# Patient Record
Sex: Female | Born: 1935 | Race: White | Hispanic: No
Health system: Southern US, Community
[De-identification: ages and names within clinical notes are randomized; demographics above are authoritative.]

---

## 2006-11-23 ENCOUNTER — Ambulatory Visit: Payer: Self-pay | Admitting: Family Medicine

## 2008-02-14 ENCOUNTER — Ambulatory Visit: Payer: Self-pay | Admitting: Family Medicine

## 2009-04-16 ENCOUNTER — Ambulatory Visit: Payer: Self-pay

## 2011-08-12 ENCOUNTER — Ambulatory Visit: Payer: Self-pay | Admitting: Family Medicine

## 2011-09-21 ENCOUNTER — Ambulatory Visit: Payer: Self-pay | Admitting: Internal Medicine

## 2011-09-21 LAB — URINALYSIS, COMPLETE
Bilirubin,UR: NEGATIVE
Glucose,UR: NEGATIVE mg/dL (ref 0–75)
Ketone: NEGATIVE
Ph: 5 (ref 4.5–8.0)
Protein: 300
Specific Gravity: 1.025 (ref 1.003–1.030)
Squamous Epithelial: NONE SEEN
WBC UR: >30 /HPF (ref 0–5)

## 2011-09-22 LAB — URINE CULTURE

## 2012-10-27 LAB — COMPREHENSIVE METABOLIC PANEL
Albumin: 3.8 g/dL (ref 3.4–5.0)
Alkaline Phosphatase: 59 U/L (ref 50–136)
BUN: 30 mg/dL — ABNORMAL HIGH (ref 7–18)
Bilirubin,Total: 0.3 mg/dL (ref 0.2–1.0)
Calcium, Total: 9 mg/dL (ref 8.5–10.1)
Chloride: 110 mmol/L — ABNORMAL HIGH (ref 98–107)
Creatinine: 1.65 mg/dL — ABNORMAL HIGH (ref 0.60–1.30)
EGFR (African American): 33 — ABNORMAL LOW
Osmolality: 290 (ref 275–301)
Potassium: 4.1 mmol/L (ref 3.5–5.1)
SGOT(AST): 23 U/L (ref 15–37)
Sodium: 141 mmol/L (ref 136–145)
Total Protein: 7.7 g/dL (ref 6.4–8.2)

## 2012-10-27 LAB — CBC
HCT: 33.2 % — ABNORMAL LOW (ref 35.0–47.0)
MCH: 29.7 pg (ref 26.0–34.0)
MCV: 91 fL (ref 80–100)
RBC: 3.63 10*6/uL — ABNORMAL LOW (ref 3.80–5.20)

## 2012-10-27 LAB — CK TOTAL AND CKMB (NOT AT ARMC)
CK, Total: 52 U/L (ref 21–215)
CK-MB: 1.1 ng/mL (ref 0.5–3.6)

## 2012-10-27 LAB — TROPONIN I: Troponin-I: <0.02 ng/mL

## 2012-10-28 ENCOUNTER — Inpatient Hospital Stay: Payer: Self-pay | Admitting: Internal Medicine

## 2012-10-28 LAB — URINALYSIS, COMPLETE
Bilirubin,UR: NEGATIVE
Blood: NEGATIVE
Glucose,UR: NEGATIVE mg/dL (ref 0–75)
Leukocyte Esterase: NEGATIVE
Nitrite: NEGATIVE
Ph: 5 (ref 4.5–8.0)
Protein: 30
RBC,UR: <1 /HPF (ref 0–5)
Squamous Epithelial: <1

## 2012-10-28 LAB — CLOSTRIDIUM DIFFICILE BY PCR

## 2012-10-28 LAB — PROTIME-INR: INR: 2.3

## 2012-10-28 LAB — CREATININE, SERUM
Creatinine: 1.09 mg/dL (ref 0.60–1.30)
EGFR (Non-African Amer.): 47 — ABNORMAL LOW

## 2013-03-04 ENCOUNTER — Inpatient Hospital Stay: Payer: Self-pay | Admitting: Student

## 2013-03-04 ENCOUNTER — Ambulatory Visit: Payer: Self-pay | Admitting: Internal Medicine

## 2013-03-04 LAB — URINALYSIS, COMPLETE
Bilirubin,UR: NEGATIVE
Glucose,UR: NEGATIVE mg/dL (ref 0–75)
Hyaline Cast: 34
Nitrite: NEGATIVE
RBC,UR: 81 /HPF (ref 0–5)
Squamous Epithelial: 3
WBC UR: 798 /HPF (ref 0–5)

## 2013-03-04 LAB — CBC
MCH: 26.8 pg (ref 26.0–34.0)
MCHC: 31.4 g/dL — ABNORMAL LOW (ref 32.0–36.0)
MCV: 85 fL (ref 80–100)
RBC: 4.19 10*6/uL (ref 3.80–5.20)
RDW: 19.2 % — ABNORMAL HIGH (ref 11.5–14.5)
WBC: 5.7 10*3/uL (ref 3.6–11.0)

## 2013-03-04 LAB — CK TOTAL AND CKMB (NOT AT ARMC)
CK, Total: 37 U/L (ref 21–215)
CK, Total: 37 U/L (ref 21–215)
CK-MB: 1.9 ng/mL (ref 0.5–3.6)

## 2013-03-04 LAB — COMPREHENSIVE METABOLIC PANEL
Albumin: 3.4 g/dL (ref 3.4–5.0)
BUN: 51 mg/dL — ABNORMAL HIGH (ref 7–18)
Chloride: 111 mmol/L — ABNORMAL HIGH (ref 98–107)
Creatinine: 1.81 mg/dL — ABNORMAL HIGH (ref 0.60–1.30)
Glucose: 107 mg/dL — ABNORMAL HIGH (ref 65–99)
Potassium: 5.3 mmol/L — ABNORMAL HIGH (ref 3.5–5.1)
SGOT(AST): 22 U/L (ref 15–37)
SGPT (ALT): 10 U/L — ABNORMAL LOW (ref 12–78)
Total Protein: 7.1 g/dL (ref 6.4–8.2)

## 2013-03-04 LAB — BASIC METABOLIC PANEL
BUN: 50 mg/dL — ABNORMAL HIGH (ref 7–18)
Calcium, Total: 8.8 mg/dL (ref 8.5–10.1)
Co2: 18 mmol/L — ABNORMAL LOW (ref 21–32)
EGFR (African American): 29 — ABNORMAL LOW
EGFR (Non-African Amer.): 25 — ABNORMAL LOW
Glucose: 83 mg/dL (ref 65–99)
Osmolality: 292 (ref 275–301)

## 2013-03-04 LAB — TROPONIN I: Troponin-I: <0.02 ng/mL

## 2013-03-04 LAB — PROTIME-INR
INR: 3.1
Prothrombin Time: 31.1 secs — ABNORMAL HIGH (ref 11.5–14.7)

## 2013-03-05 LAB — COMPREHENSIVE METABOLIC PANEL
Alkaline Phosphatase: 72 U/L (ref 50–136)
Anion Gap: 10 (ref 7–16)
Bilirubin,Total: 0.5 mg/dL (ref 0.2–1.0)
Calcium, Total: 8.6 mg/dL (ref 8.5–10.1)
Chloride: 113 mmol/L — ABNORMAL HIGH (ref 98–107)
Co2: 19 mmol/L — ABNORMAL LOW (ref 21–32)
EGFR (Non-African Amer.): 32 — ABNORMAL LOW
Glucose: 63 mg/dL — ABNORMAL LOW (ref 65–99)
Osmolality: 293 (ref 275–301)
Potassium: 4.4 mmol/L (ref 3.5–5.1)
SGOT(AST): 14 U/L — ABNORMAL LOW (ref 15–37)
Sodium: 142 mmol/L (ref 136–145)
Total Protein: 6 g/dL — ABNORMAL LOW (ref 6.4–8.2)

## 2013-03-05 LAB — CBC WITH DIFFERENTIAL/PLATELET
HGB: 9.5 g/dL — ABNORMAL LOW (ref 12.0–16.0)
Lymphocyte %: 13.9 %
MCH: 26.9 pg (ref 26.0–34.0)
MCHC: 31.6 g/dL — ABNORMAL LOW (ref 32.0–36.0)
MCV: 85 fL (ref 80–100)
Neutrophil #: 3.1 10*3/uL (ref 1.4–6.5)
RBC: 3.54 10*6/uL — ABNORMAL LOW (ref 3.80–5.20)
WBC: 4.5 10*3/uL (ref 3.6–11.0)

## 2013-03-05 LAB — CK TOTAL AND CKMB (NOT AT ARMC)
CK, Total: 30 U/L (ref 21–215)
CK-MB: 1.4 ng/mL (ref 0.5–3.6)

## 2013-03-05 LAB — PROTIME-INR: INR: 2.9

## 2013-03-05 LAB — TROPONIN I: Troponin-I: <0.02 ng/mL

## 2013-03-06 LAB — CBC WITH DIFFERENTIAL/PLATELET
Basophil #: 0 10*3/uL (ref 0.0–0.1)
Basophil %: 0.6 %
Eosinophil #: 0.1 10*3/uL (ref 0.0–0.7)
Eosinophil %: 2.3 %
Lymphocyte #: 0.4 10*3/uL — ABNORMAL LOW (ref 1.0–3.6)
Lymphocyte %: 10.7 %
MCHC: 32 g/dL (ref 32.0–36.0)
Monocyte #: 0.6 x10 3/mm (ref 0.2–0.9)
Monocyte %: 16.6 %
Neutrophil #: 2.6 10*3/uL (ref 1.4–6.5)
Platelet: 93 10*3/uL — ABNORMAL LOW (ref 150–440)
RBC: 3.67 10*6/uL — ABNORMAL LOW (ref 3.80–5.20)
RDW: 20 % — ABNORMAL HIGH (ref 11.5–14.5)

## 2013-03-06 LAB — BASIC METABOLIC PANEL
Anion Gap: 8 (ref 7–16)
Chloride: 114 mmol/L — ABNORMAL HIGH (ref 98–107)
Co2: 21 mmol/L (ref 21–32)
Creatinine: 1.3 mg/dL (ref 0.60–1.30)
Glucose: 72 mg/dL (ref 65–99)
Osmolality: 290 (ref 275–301)
Potassium: 3.8 mmol/L (ref 3.5–5.1)
Sodium: 143 mmol/L (ref 136–145)

## 2013-03-07 LAB — URINE CULTURE

## 2013-03-07 LAB — PROTIME-INR: INR: 2.2

## 2013-03-08 LAB — BASIC METABOLIC PANEL
Anion Gap: 6 — ABNORMAL LOW (ref 7–16)
BUN: 15 mg/dL (ref 7–18)
Chloride: 112 mmol/L — ABNORMAL HIGH (ref 98–107)
EGFR (Non-African Amer.): 53 — ABNORMAL LOW
Potassium: 3.4 mmol/L — ABNORMAL LOW (ref 3.5–5.1)
Sodium: 144 mmol/L (ref 136–145)

## 2013-03-09 LAB — PROTIME-INR: INR: 2

## 2013-03-10 LAB — HEMOGLOBIN: HGB: 10.1 g/dL — ABNORMAL LOW (ref 12.0–16.0)

## 2013-03-10 LAB — PROTIME-INR: INR: 1.7

## 2013-03-11 LAB — CULTURE, BLOOD (SINGLE)

## 2013-03-12 ENCOUNTER — Ambulatory Visit: Payer: Self-pay | Admitting: Internal Medicine

## 2014-09-01 NOTE — H&P (Signed)
PATIENT NAME:  Shawna Browning, UPSHAW MR#:  [id number] DATE OF BIRTH:  Oct 08, 1928  PRIMARY CARE PHYSICIAN: Barry Brunner, MD.  HISTORY OF PRESENT ILLNESS: The patient is an 81 year old Caucasian female with past medical history significant for history of multiple medical problems including diabetes, hyperlipidemia, hypertension, stroke in May 2013 which affected her mentation with worsening dementia, who presents to the hospital with complaints of confusion.   According to the patient's daughter, who is present during my interview, the patient has been more confused over the past one day. She has been also wheezing and then has more increasingly lethargy. She is short of breath and was noted to be hypoxic.   On arrival to the Emergency Room, she was noted to be in atrial fibrillation with heart rate of 140. She was given 10 mg of Cardizem which improved her heart rate to around 100s to 120s. She, however, is on nonrebreather since she is very short of breath as well as hypoxic. Her chest x-ray reveals congestive heart failure. Hospitalist services were contacted for admission.   The patient's daughter tells me that she usually has some element of confusion but it seems to worsen intermittently. She also has intermittent agitation as well as shortness of breath in the  nursing facility. She has gone through multiple medical issues. She was admitted in August 2014 at [facility]. She had bleeding, received 3 units of packed red blood cells. Bleeding was from a sore in her mouth. Her Coumadin was stopped for a short period of time but then now it is restarted since her stroke was determined to be embolic.   Even before discharge from [facility], she was noted to be confused. The patient's daughter thinks that she is not on any medications to control her heart rhythm; however, upon looking into her medication list, it appears that the patient is in fact on metoprolol.    PAST MEDICAL HISTORY: Significant for history of stroke with some mental decline since May 2013, history of diabetes mellitus, paroxysmal atrial fibrillation. The patient is on Coumadin therapy. History of hypertension, hyperlipidemia, diabetes mellitus, depression.   ALLERGIES: No known drug allergies.   MEDICATIONS:  The patient is on: 1.  Claritin10 mg p.o. daily.  2.  Cozaar 100 mg daily.  3.  Iron sulfate 325 mg p.o. twice daily.  4.  Hydralazine 25 mg p.o. every 8 hours.  5.  Keppra 500 mg p.o. twice daily.  6.  Macrodantin 50 mg p.o. at bedtime.  7.  Metformin 1 g twice daily.  8.  Metoprolol tartrate 50 mg p.o. twice daily.  9.  Prilosec 10 mg daily.  10.  Tylenol 325 mg two capsules every 8 hours around the clock. 11.  Ventolin 2 puffs every 4 hours.  12.  Warfarin 2.5 mg p.o. daily.  13.  Zocor 40 mg p.o. daily.   ALLERGIES: No known drug allergies.   SOCIAL HISTORY: The patient lives in [facility]. No history of smoking or alcohol abuse.   FAMILY HISTORY: Diabetes. The patient's mother lived to be 44. The patient's father was 76 when he died.   REVIEW OF SYSTEMS: Not obtainable; however, the patient denies any chest pains. Denies any abdominal pains or headaches. Denies any shortness of breath at present.   PHYSICAL EXAMINATION:  VITAL SIGNS: On arrival to the Emergency Room, the patient's vital signs, temperature is not measured. Pulse 136, respirations 24, blood pressure 133/66, and during my evaluation, her  blood pressure is as high as 170 intermittently. Oxygen saturation were not checked but evidently were low to put her on a nonrebreather.  GENERAL: During my evaluation, the patient is well developed, thin, uncomfortable, in moderate distress, a Caucasian female lying on the stretcher.  HEENT: Her pupils were equal, reactive to light, extraocular muscles are intact. The patient does have conjunctivitis. There is purulent discharge from her  eyes.  No icterus. Has normal hearing. No pharyngeal erythema. Mucosa is moist. The patient does have a nonrebreather on her mouth. She is breathing through her mouth.  NECK: No nodes or masses. Supple, nontender, without mass or adenopathy. No JVD or carotid bruits. Full range of motion.  LUNGS: Diminished breath sounds bilaterally; however, no significant rales, rhonchi or wheezing. The patient does have labored inspirations as well as increased effort to breathe. She is tachypneic. She is in moderate respiratory distress. She wheezes through her mouth.  CARDIOVASCULAR: S1, S2 appreciated. Irregularly irregular, distant. Chest is nontender to palpation.  EXTREMITIES: Show 1+ pedal pulses. No lower extremity edema, calf tenderness or cyanosis were noted.  ABDOMEN: Soft, nontender. Bowel sounds are present. No hepatosplenomegaly or masses were noted.  RECTAL: Deferred.  MUSCLE STRENGTH: Able to move all extremities. No cyanosis. The patient does have kyphosis. Gait is not tested.  SKIN: No rashes, lesions, erythema, nodularity or induration. It was warm and dry to palpation, somewhat erythematous, lower extremities, more on the left, but peripheral pulses are 1+.  LYMPHATIC: No adenopathy in the cervical region.  NEUROLOGICAL: Cranial nerves grossly intact. Difficult to obtain sensory. The patient does not have any dysarthria; however, I am not able to discuss her much, practically nonverbal. She is alert but somnolent, poorly cooperative. Not able to assess her memory and not able to see if she is confused, but intermittently agitated as well as restless, moving on the stretcher constantly.   EKG showed AFib at the rate of 125 beats per minute, rapid ventricular response. T-wave abnormality. Consider lateral ischemia or digitalis effect according to EKG criteria. Nonspecific ST-T changes.   LABORATORIES: Glucose 107, beta-type natriuretic peptide 21,885. BUN and creatinine were 51 and 1.81. Potassium  was 5.3. Bicarbonate was 18. Estimated GFR for non-African American would be 26. Liver enzymes were unremarkable. Cardiac enzymes, first set negative. CBC: White blood cell count 5.7, hemoglobin 11.2, platelet count 127. Coagulation panel: Pro time 31.1 and INR was 3.1. Urinalysis is pending. ABGs were done on 100% FiO2 and pH was 7.32, pCO2 24, pO2 281 and saturation 98.1% on nonrebreather with bicarbonate level of 12.4 and lactic acid level elevated to 2.1   RADIOLOGIC STUDIES: Chest x-ray, portable, single view, on the 24th of October 2014, showed findings suggesting mild CHF. CT scan of head without contrast, 24th of October 2014, revealed no evidence of acute ischemic or hemorrhagic infarction, no intracranial mass effect. There is encephalomalacia in the right frontal lobe which is stable. There is mild diffuse cerebral and cerebellar atrophy with compensatory ventriculomegaly, which is stable according to radiologist.   ASSESSMENT AND PLAN:  1.  Atrial fibrillation, rapid ventricular response. Admit the patient to the medical floor. Start on Cardizem p.o., holding metoprolol at this time. We will also initiate her on Cardizem IV as needed since Cardizem seems to be working for her quite well. We will hold Coumadin since the patient is supra-therapeutic. Will get echocardiogram. We will follow INR in the morning and will restart Coumadin if it is therapeutic.  2.  Congestive heart  failure, left heart, acute, likely due to rapid ventricular response. We will continue oxygen therapy. We will give her Lasix 1 dose at this time. Will follow when the patient's heart rate is somewhat better. Hopefully congestive heart failure will improve by itself when the patient's heart rate improves.  3.  Renal failure, likely acute. We will hold ARB as well as metformin. We will follow in the morning.  4.  Hyperkalemia. We will give dose of Lasix.  5.  Acidosis, likely due to poor perfusion. We will be following the  patient's blood pressure readings.  6.  Hypertension. We will add nitroglycerin topically at this time due to congestive heart failure as well as questionable ability to swallow.  7.  Diabetes mellitus. We will continue diabetic diet as well as sliding-scale insulin.  8.  Hyperlipidemia. We will continue statin as long as she is able to swallow.   TIME SPENT: One hour.  CODE STATUS: I discussed the patient's CODE STATUS as well as multiple medical issues with patient's daughter who was agreeable to get palliative care consultation, which was also discussed with Sharia ReeveJosh, NP, for palliative care.    ____________________________ Katharina Caperima Angelyne Terwilliger, MD rv:np D: 03/04/2013 16:00:41 ET T: 03/04/2013 16:49:18 ET JOB#: [id number]  cc: Katharina Caperima Braxxton Stoudt, MD, <Dictator> Jorje GuildGlenn R. Beckey DowningWillett, MD Katharina CaperIMA Mahmood Boehringer MD ELECTRONICALLY SIGNED 04/10/2013 20:39

## 2014-09-01 NOTE — Discharge Summary (Signed)
PATIENT NAME:  Shawna Browning, Adasyn MR#:  [id number] DATE OF BIRTH:  Feb 05, 1929  DATE OF ADMISSION:  10/28/2012 DATE OF DISCHARGE:  10/28/2012  DISCHARGE DIAGNOSES:   1.  New onset seizure likely due to her recent cerebrovascular accident. She had a negative EEG, started on Keppra and stopped Wellbutrin per neurology.  2.  Acute renal failure likely acute renal in nature improved with hydration. Recommended better oral intake.   SECONDARY DIAGNOSES:   1.  Hypertension.  2.  Diabetes.  3.  Hyperlipidemia.  4.  Depression.  5.  History of cerebrovascular accident.  6.  Paroxysmal atrial fibrillation.   CONSULTATIONS:   1.  Neurology, Dr. Mellody DrownMatthew Smith.  2.  Physical therapy.   PROCEDURES/RADIOLOGY:   1.  CT scan of the head without contrast on June 18th showed no acute intracranial abnormality and old right frontal lobe infarct.  2.  The patient had an EEG on June 19th which was negative for any active seizure.   MAJOR LABORATORY PANEL:   1.  Urinalysis on admission was negative.  2.  Stool for C. difficile was negative.   HISTORY AND SHORT HOSPITAL COURSE:  The patient is an 81 year old female with the above-mentioned medical problems who was admitted for new onset seizure. The patient was loaded with Keppra and subsequently was started on oral Keppra. She was placed on p.r.n. Ativan for seizure precautions. Please see Dr. Teena IraniElgergawy's dictated history and physical for further details. The patient was also evaluated by neurology, Dr. Mellody DrownMatthew Smith, who recommended stopping Wellbutrin and continuing Keppra. The patient remained seizure-free. She was back to her normal mental status and was discharged back to her nursing facility in stable condition. On the date of discharge, her vital signs are as follows: Temperature 97.7, heart rate 60 per minute, respirations 18 per minute, blood pressure 156/68 mmHg and she is saturating 99% on room air.   PERTINENT PHYSICAL EXAMINATION ON THE DATE OF  DISCHARGE:  CARDIOVASCULAR: S1, S2 normal. No murmurs, rubs or gallop.  LUNGS: Clear to auscultation bilaterally. No wheezing, rales, rhonchi or crepitation.  ABDOMEN: Soft, benign.  NEUROLOGIC: Nonfocal examination. All other physical examination remained at baseline.   DISCHARGE MEDICATIONS:   1.  Prilosec 20 mg p.o. daily. 2.  Claritin 10 mg p.o. daily.  3.  Metoprolol 50 mg p.o. b.i.d.  4.  Iron sulfate 325 mg p.o. b.i.d.  5.  Cozaar 100 mg p.o. daily.  6.  Macrodantin 50 mg p.o. at bedtime.  7.  Zocor 40 mg p.o. at bedtime.  8.  Tylenol 650 mg p.o. every 8 hours.  9.  Warfarin 2.5 mg p.o. every evening.  10.  Metformin 1000 mg p.o. b.i.d. with meals.  11.  Ventolin 2 puffs inhaled every 4 hours as needed.  12.  Hydralazine 25 mg p.o. every 8 hours.  13.  Keppra 500 mg p.o. b.i.d.   DISCHARGE DIET:  Low sodium, low fat, low cholesterol, and 1800 ADA.   DISCHARGE ACTIVITY:  As tolerated.   DISCHARGE INSTRUCTIONS AND FOLLOWUP:  The patient was instructed to follow up with her new primary care physician, Dr. Duncan Dulleresa Tullo or [facility] Ronna [facility]ennifer Walker [facility]. Her daughter wanted to switch her primary care. She will need followup with her [facility] neurologist in 4 to 6 weeks.   TOTAL TIME DISCHARGING THIS PATIENT:  55 minutes.    ____________________________ Ellamae SiaVipul S. Sherryll BurgerShah, MD vss:si D: 10/28/2012 16:18:00 ET T: 10/28/2012 16:49:25 ET JOB#: [id number]  cc:  Hyde Sires S. Sherryll Burger, MD, <Dictator> Ginette Pitman. Dan Humphreys, MD Duncan Dull, MD Troy Sine Katrinka Blazing, MD [facility]   [name] [facility] MD ELECTRONICALLY SIGNED 10/31/2012 15:09

## 2014-09-01 NOTE — Consult Note (Signed)
Referring Physician:  Albertine Patricia   Primary Care Physician:  Albertine Patricia [place], [address], [place], [place] [address], [place] [phone]  Reason for Consult: Admit Date: 27-Oct-2012  Chief Complaint: seizure  Reason for Consult: new onset seizure   History of Present Illness: History of Present Illness:   81 yo RHD F presents to [facility] after new onset seizure.  This was witnessed by the staff at her long term facility.  No prior seizures before.  Pt states that she is a little depressed about the place that she is living but that she is not taking bupropion.  No other complaints now.  Pt does not remember event and was confused afterward.  No incontinence or tongue biting.  ROS:  General denies complaints    HEENT no complaints    Lungs no complaints    Cardiac no complaints    GI no complaints    GU no complaints    Musculoskeletal no complaints    Extremities no complaints    Skin no complaints    Neuro no complaints    Endocrine no complaints    Psych no complaints    Past Medical/Surgical Hx:  UTI'S:   Past Medical/ Surgical Hx:  Past Medical History 1. Diabetes mellitus.  2. Hypercholesterolemia.  3. Hypertension.  4. Depression.  5. History of CVA.  6. Paroxysmal atrial fibrillation.   Past Surgical History none   Home Medications: Medication Instructions Last Modified Date/Time  PriLOSEC 20 mg oral delayed release capsule 1 cap(s) orally once a day 19-Jun-14 04:36  Claritin 10 mg oral tablet 1 tab(s) orally once a day 19-Jun-14 04:36  Metoprolol Tartrate 50 mg oral tablet 1 tab(s) orally 2 times a day 19-Jun-14 04:36  ferrous sulfate 325 mg oral delayed release tablet 1 tab(s) orally 2 times a day 19-Jun-14 04:36  buPROPion 75 mg oral tablet 1 tab(s) orally 2 times a day 19-Jun-14 04:36  Cozaar 100 mg oral tablet 1 tab(s) orally once a day 19-Jun-14 04:36  Macrodantin macrocrystals 50 mg oral  capsule 50 milligram(s) orally once a day (at bedtime) 19-Jun-14 04:36  Zocor 40 mg oral tablet 1 tab(s) orally once a day (at bedtime) 19-Jun-14 04:36  Tylenol 325 mg oral tablet 2 tab(s) orally every 8 hours 19-Jun-14 04:36  warfarin 2.5 mg oral tablet 1 tab(s) orally once a day (in the evening) _0  19-Jun-14 04:36  metFORMIN 1000 mg oral tablet 1 tab(s) orally 2 times a day (with meals) 19-Jun-14 04:36  Ventolin HFA CFC free 90 mcg/inh inhalation aerosol 2 puff(s) inhaled every 4 hours as needed for wheezing 19-Jun-14 04:36  hydrALAZINE 25 mg oral tablet 1 tab(s) orally every 8 hours 19-Jun-14 04:36   Allergies:  No Known Allergies:   Allergies:  Allergies NKDA    Social/Family History: Employment Status: disabled  Lives With: alone  Living Arrangements: assisted living  Social History: no tob, no EtOH, no illicits, lives in long term facility  Family History: no seizure, no stroke   Vital Signs: **Vital Signs.:   19-Jun-14 04:10  Vital Signs Type Admission  Temperature Temperature (F) 97.4  Celsius 36.3  Temperature Source oral  Pulse Pulse 65  Respirations Respirations 20  Systolic BP Systolic BP 664  Diastolic BP (mmHg) Diastolic BP (mmHg) 60  Mean BP 93  Pulse Ox % Pulse Ox % 94  Oxygen Delivery Room Air/ 21 %   Physical Exam: General: NAD, resting comfortable, appropiate weight  HEENT: PERRLA, EOMI, nl VF, face symmetric, tongue midline, shoulder shrug equal  Neck: supple, no JVD, no bruits  Chest: CTA B, no wheezing, good movement  Cardiac: RRR, no murmurs, no edema, 2+ pulses  Extremities: no C/C/E, FROM   Neurologic Exam: Mental Status: alert and oriented x 3, normal speech and language, follows complex commands  Cranial Nerves: PERRLA, EOMI, nl VF, face symmetric, tongue midline, shoulder shrug equal  Motor Exam: 5/5 B normal, tone, no tremor  Deep Tendon Reflexes: 1+/4 B, downgoing plantars  Sensory Exam: pinprick, temperature, and vibration intact B   Coordination: FTN and HTS WNL, nl RAM   Lab Results:  Hepatic:  18-Jun-14 23:09   Bilirubin, Total 0.3  Alkaline Phosphatase 59  SGPT (ALT) 19  SGOT (AST) 23  Total Protein, Serum 7.7  Albumin, Serum 3.8  Routine Micro:  18-Jun-14 23:32   Micro Text Report CLOS.DIFF ASSAY, RT-PCR   COMMENT                   NEGATIVE-CLOS.DIFFICILE TOXIN NOT DETECTED BY PCR   ANTIBIOTIC                       Routine Chem:  18-Jun-14 23:09   Glucose, Serum  149  BUN  30  Creatinine (comp)  1.65  Sodium, Serum 141  Potassium, Serum 4.1  Chloride, Serum  110  CO2, Serum 21  Calcium (Total), Serum 9.0  Osmolality (calc) 290  eGFR (African American)  33  eGFR (Non-African American)  29 (eGFR values <38m/min/1.73 m2 may be an indication of chronic kidney disease (CKD). Calculated eGFR is useful in patients with stable renal function. The eGFR calculation will not be reliable in acutely ill patients when serum creatinine is changing rapidly. It is not useful in  patients on dialysis. The eGFR calculation may not be applicable to patients at the low and high extremes of body sizes, pregnant women, and vegetarians.)  Anion Gap 10  Cardiac:  18-Jun-14 23:09   CK, Total 52  CPK-MB, Serum 1.1 (Result(s) reported on 27 Oct 2012 at 11:52PM.)  Troponin I < 0.02 (0.00-0.05 0.05 ng/mL or less: NEGATIVE  Repeat testing in 3-6 hrs  if clinically indicated. >0.05 ng/mL: POTENTIAL  MYOCARDIAL INJURY. Repeat  testing in 3-6 hrs if  clinically indicated. NOTE: An increase or decrease  of 30% or more on serial  testing suggests a  clinically important change)  Routine UA:  19-Jun-14 00:48   Color (UA) Yellow  Clarity (UA) Clear  Glucose (UA) Negative  Bilirubin (UA) Negative  Ketones (UA) Negative  Specific Gravity (UA) 1.017  Blood (UA) Negative  pH (UA) 5.0  Protein (UA) 30 mg/dL  Nitrite (UA) Negative  Leukocyte Esterase (UA) Negative (Result(s) reported on 28 Oct 2012 at 02:07AM.)   RBC (UA) <1 /HPF  WBC (UA) 1 /HPF  Bacteria (UA) 2+  Epithelial Cells (UA) <1 /HPF  Mucous (UA) PRESENT  Hyaline Cast (UA) 1 /LPF (Result(s) reported on 28 Oct 2012 at 02:07AM.)  Routine Coag:  18-Jun-14 23:09   Prothrombin  24.5  INR 2.3 (INR reference interval applies to patients on anticoagulant therapy. A single INR therapeutic range for coumarins is not optimal for all indications; however, the suggested range for most indications is 2.0 - 3.0. Exceptions to the INR Reference Range may include: Prosthetic heart valves, acute myocardial infarction, prevention of myocardial infarction, and combinations of aspirin and anticoagulant. The need for a higher or lower  target INR must be assessed individually. Reference: The Pharmacology and Management of the Vitamin K  antagonists: the seventh ACCP Conference on Antithrombotic and Thrombolytic Therapy. CHENI.7782 Sept:126 (3suppl): N9146842. A HCT value >55% may artifactually increase the PT.  In one study,  the increase was an average of 25%. Reference:  "Effect on Routine and Special Coagulation Testing Values of Citrate Anticoagulant Adjustment in Patients with High HCT Values." American Journal of Clinical Pathology 2006;126:400-405.)  Routine Hem:  18-Jun-14 23:09   WBC (CBC) 8.1  RBC (CBC)  3.63  Hemoglobin (CBC)  10.8  Hematocrit (CBC)  33.2  Platelet Count (CBC) 155 (Result(s) reported on 27 Oct 2012 at 11:36PM.)  MCV 91  MCH 29.7  MCHC 32.5  RDW  15.3   Radiology Results: CT:    18-Jun-14 23:08, CT Head Without Contrast  CT Head Without Contrast   REASON FOR EXAM:    Seizure  COMMENTS:       PROCEDURE: CT  - CT HEAD WITHOUT CONTRAST  - Oct 27 2012 11:08PM     RESULT: Comparison:  None    Technique: Multiple axial images from the foramen magnum to the vertex   were obtained without IV contrast.    Findings:    There is no evidence for mass effect, midline shift, or extra-axial fluid   collections. There is  no evidence for space-occupying lesion,   intracranial hemorrhage, or acute cortical-based area of infarction.   There is a large area of encephalomalacia in the right frontal lobe from     old prior infarct. Mild prominence of the lateral ventricles is likely   related to central atrophy.    The osseous structures are unremarkable.    IMPRESSION:    No acute intracranial findings. Old right frontal lobe infarct.        Verified By: Gregor Hams, M.D., MD   Radiology Impression: Radiology Impression: CT of head personally reviewed by me and shows old R frontal infarct   Impression/Recommendations: Recommendations:   case d/w Dr. Manuella Ghazi reviewed by me notes reviewed by me   New onset seizure-  pt apparently had some previous fluctuations in mental status as well that could have been seizures.  Pt had an EEG at [facility] previous that was normal plus a normal EEG here but this does not rule out epilepsy. Old R frontal infarct-  most likely cardioembolic from Afib continue Keppra 53m BID stop Bupropion and can change to any other antidepressant no driving or operating heavy machinery x 6 months continue coumadin with goal INR 2-3 for stroke prevention ok [facility]rose Nakayam[facility]-3 months  Electronic Signatures: SJamison Neighbor(MD)  (Signed 19-Jun-14 14:32)  Authored: REFERRING PHYSICIAN, Primary Care Physician, Consult, History of Present Illness, Review of Systems, PAST MEDICAL/SURGICAL HISTORY, HOME MEDICATIONS, ALLERGIES, Social/Family History, NURSING VITAL SIGNS, Physical Exam-, LAB RESULTS, RADIOLOGY RESULTS, Recommendations   Last Updated: 19-Jun-14 14:32 by SJamison Neighbor(MD)

## 2014-09-01 NOTE — H&P (Signed)
PATIENT NAME:  Shawna Browning, Shawna Browning MR#:  [id number] DATE OF BIRTH:  [birth year]  DATE OF ADMISSION:  10/28/2012  REFERRING PHYSICIAN: Dr. Marjean Donna.   PRIMARY CARE PHYSICIAN: Currently following with Attending in the nursing home. Used to follow with Dr. Ilene Qua but not anymore.   CHIEF COMPLAINT: Seizure.   HISTORY OF PRESENT ILLNESS: This is an 81 year old female with significant past medical history of hypertension, hyperlipidemia, diabetes mellitus. Had a major CVA last year with diagnosis of paroxysmal Afib where she was started on warfarin. The patient, as per daughter, who gives most of the history, has been having some cognitive and memory problems since then. As well, she is having left lower extremity weakness. Ambulates occasionally with a walker but mainly dependent on a wheelchair. The patient had an episode of generalized seizure this evening. As per nursing home, the patient had generalized tonic-clonic seizure where she was unresponsive after that. Upon presentation to ED, the patient was confused, but her mentation continued to improve gradually. The patient was found to be stool and urine incontinent, but daughter reports she is usually incontinent over the last few months. As well, there was no tongue biting. The patient was loaded with IV Keppra in the ED. The patient had CT head without contrast which did show encephalomalacia from old right frontal lobe infarct. The patient is an extremely poor historian, cannot give any reliable history. Most of the history is obtained from the daughter at bedside and from ED staff and nursing home documentation. The patient had no recurrence of seizures in the ED. As per daughter, the patient does not have any new focal deficits and she appears to be back to her baseline at this point.   PAST MEDICAL HISTORY:  1. Diabetes mellitus.  2. Hypercholesterolemia.  3. Hypertension.  4. Depression.  5. History of CVA.  6. Paroxysmal atrial  fibrillation.   ALLERGIES: No known drug allergies.   SOCIAL HISTORY: The patient lives in a skilled nursing facility. No history of smoking or alcohol use.   FAMILY HISTORY: There is no family history of diabetes. Mother lived to be 30. Father lived to be 79 years old.   REVIEW OF SYSTEMS: Cannot be obtained at this point by the patient as she is still lethargic and confused.   HOME MEDICATIONS:  1. Tylenol 650 every 8 hours.  2. Cozaar 100 mg oral daily.  3. Warfarin 2.5 mg oral daily.  4. Metformin 1000 mg oral 2 times a day.  5. Claritin 10 mg oral daily.  6. Zocor 40 mg oral at bedtime.  7. Metoprolol tartrate 50 mg oral 2 times a day.  8. Ventolin 2 puffs 4 times a day.  9. Ferrous sulfate 325 mg oral 2 times a day.  10. Prilosec 20 mg oral daily.  11. Bupropion 75 mg oral 2 times a day.  12. Hydralazine 25 mg oral 2 times a day.  13. Nitrofurantoin 50 mg at bedtime.   PHYSICAL EXAMINATION:  VITAL SIGNS: Pulse 97, respiratory rate 18, blood pressure 174/96, saturating 98% on room air.  GENERAL: Elderly female, looks comfortable, in no apparent distress.  HEENT: Head is atraumatic, normocephalic. Pupils equal, reactive to light. Pink conjunctivae. Anicteric sclerae. Moist oral mucosa.  NECK: Supple. No thyromegaly. No JVD.  CHEST: Good air entry bilaterally. No wheezing, rales, rhonchi.  CARDIOVASCULAR: S1, S2 heard. No rubs, murmurs, gallops.  ABDOMEN: Soft, nontender, nondistended. Bowel sounds present.  EXTREMITIES: No edema. No clubbing. No cyanosis. Dorsalis pedis pulses  felt bilaterally.  PSYCHIATRIC: The patient wakes up, responds to verbal stimuli. Communicative but confused. Answers name. Knows she is in the hospital, but she cannot recall the name of the hospital. Does not know the name of the President, and does not know the date. Confused.  NEUROLOGIC: The patient is noncompliant with physical exam. Does not follow commands secondary to her lethargy but appears to  be moving all extremities without significant focal deficits. There appear to be some mild deficits in the left lower extremity.  SKIN: Normal skin turgor. Warm and dry.   PERTINENT LABS: Glucose 149, BUN 30, creatinine 1.65, sodium 141, potassium 4.1, chloride 110, CO2 21. Total protein 7.7, total bili 0.3, alk phos 59, AST 23, ALT 19. Troponin less than 0.02. White blood cell 8.1, hemoglobin 10.8, hematocrit 33.2, platelets 155. Urinalysis negative for leukocyte esterase and nitrites and white blood cells less than 1. CT brain showing encephalomalacia due to old infarction in the right frontal lobe.   ASSESSMENT AND PLAN:  1. New onset seizures: The patient appears to be having generalized tonic seizures. This is most likely related to her significant old right frontal cerebrovascular accident. Will check an electroencephalogram in the a.m. Will consult neurological service as well. Will have her on seizure precautions, on p.r.n. Ativan for seizure breakthrough and will start her on Keppra. Already loaded with intravenous Keppra in the Emergency Department. Will continue with 500 p.o. b.i.d. and will follow neurology's recommendations regarding medication.  2. Acute renal failure: The patient has elevated creatinine at 1.65. Her records last year showing creatinine of 0.9. Appears to be in acute renal failure. Will hold Cozaar. Will continue her with intravenous fluids. Will recheck creatinine level in a.m.  3. Paroxysmal atrial fibrillation: Currently in normal sinus rhythm. On anticoagulation. Will continue with warfarin. Will consult pharmacy to dose. Will check INR level. Will continue with metoprolol.  4. Chronic urinary tract infection: Continue with nitrofurantoin.  5. Diabetes mellitus: Hold oral agents. Continue with insulin sliding scale.  6. Hypertension, elevated: Will resume the patient on her home meds.  7. Depression: Continue with home medications.  8. Hyperlipidemia: Continue with  Zocor.  9. Deep vein thrombosis prophylaxis: The patient is on warfarin. Will have pharmacy consulted to dose the warfarin, is on warfarin due to secondary to her history of atrial fibrillation.   Daughter is her power of attorney. Reports the patient does not have a Living Will, and she reports she is FULL CODE.   TOTAL TIME SPENT ON ADMISSION AND PATIENT CARE: 55 minutes.   ____________________________ Albertine Patricia, MD dse:gb D: 10/28/2012 04:01:51 ET T: 10/28/2012 04:20:03 ET JOB#: [id number]  cc: Albertine Patricia, MD, <Dictator> Aleane Wesenberg Graciela Husbands MD ELECTRONICALLY SIGNED 10/29/2012 0:26

## 2014-09-01 NOTE — Discharge Summary (Signed)
PATIENT NAME:  Shawna Browning, Shawna Browning MR#:  [id number] DATE OF BIRTH:  06-21-1928  DATE OF ADMISSION:  03/04/2013 DATE OF DISCHARGE:  03/10/2013   PRIMARY CARE PHYSICIAN: Shawna MaplesGlenn R. Beckey DowningWillett, MD  CONSULTANTS: Shawna GraceNancy Phifer, MD, from palliative care.   CHIEF COMPLAINT: Brought in for confusion.   DISCHARGE DIAGNOSES:  1. Atrial fibrillation with rapid ventricular response.  2. Acute hypoxic respiratory failure likely secondary to acute diastolic congestive heart failure.  3. Acute on chronic renal failure.  4. Extended-spectrum beta-lactamase urinary tract infection.  5. Failure to thrive.  6. Advanced dementia.  7. Hyperkalemia.  8. Hypokalemia.  9. Acidosis.  10. History of stroke.  11. History of diabetes.  12. Hypertension.  13. Hyperlipidemia.  14. Depression.   DISCHARGE MEDICATIONS:  1. Prilosec 20 mg once a day.  2. Claritin 10 mg once a day.  3. Metoprolol tartrate 50 mg 2 times a day.  4. Tylenol 650 mg every 8 hours. 5. Keppra 500 mg every 12 hours.  6. Simvastatin 10 mg once a day.  7. Aspirin 81 mg daily.  8. Diltiazem 30 mg q.6 hours.  9. Meropenem 1 gram IV q.12 hours for 3 days, then stop, and discontinued the PICC line.   DIET: ADA.   ACTIVITY: As tolerated.   REFERRALS: To hospice.   FOLLOWUP: Please follow with PCP within 1 to 2 weeks.   DISCHARGE INSTRUCTIONS: Encourage p.o. intake. She will also be going with [facility] for palliative purposes.  HISTORY OF PRESENT ILLNESS AND HOSPITAL COURSE: For full details of H and P, please see the dictation on October 24 by Dr. Winona Browning, but briefly, this is a pleasant 81 year old female with a history of hypertension and stroke last year, with worsening dementia, who came in for confusion, shortness of breath, wheezing, lethargy and was noted to be in atrial fibrillation with RVR, rate of 140s. The patient was given IV Cardizem and also needed high FiO2 oxygen. The patient was admitted to the hospitalist service for atrial  fibrillation and acute respiratory failure, likely diastolic CHF in nature. The patient did have elevated BNP of 21,000 with acute renal failure as well, with initial creatinine of 1.81 with potassium of 5.3. The patient was ruled out for acute coronary syndrome with negative cardiac markers x3. The patient did not have any significant leukocytosis, with WBC of 5.7 on admission. Urine cultures were sent as the patient did have a positive UA with leukocyte esterase, elevated WBC of 798 and 3+ bacteria in the UA. She appeared labored with shortness of breath. The patient was maintained on high FiO2 and slowly weaned off to room air. The patient did have acute CHF, and echocardiogram was obtained showing EF of 60% to 65%, with moderate LVH. The patient did well in regards to the respiratory failure and weaned off of the oxygen. Currently, she is on room air. She is not volume overloaded. Her renal failure did improve as well. The atrial fibrillation was better controlled. We believed that respiratory failure was triggered by atrial fibrillation causing congestive heart failure. She did not have any significant fever or leukocytosis to suggest she was having a pneumonia. Currently, she is on day 7 of meropenem for her ESBL UTI, which is E. coli, and she was started on meropenem early in the admission. On day 5 of admission, 1 out of 4 bottles of the cultures of the blood came back gram-positive and appears to be coag-negative staph and likely contaminant. The patient again has improved, and  this is likely not a true infection. Currently, she is on room air, satting 95% with stable blood pressure of 120s over 70s and heart rate in the 70s and 80s generally. Overall, the patient has improved in regards to renal and respiratory function; however, has not improved significantly from a failure to thrive perspective. The patient has been having calorie count for the last 3 days, and on average the last 3 days, has been having  about 450 calories or so. This is not enough to meet survival, and the daughter is aware. She will be getting discharged with hospice at [facility]. Palliative care has been on board through the hospitalization. In regards to her diabetes, as her diet is not good, she will not be going on any diabetes medications. Her hyperkalemia is better with a dose of Lasix. The patient did have another dose of Lasix later in the hospitalization, and at this point, does not require daily dosage of Lasix as her p.o. intake is minimum and she does not appear to be labored. In regards to the atrial fibrillation, as stated above, it is rate controlled, and given the overall poor prognosis, the risks and benefits of continuation of anticoagulation were discussed with the daughter, who at this point does not want Coumadin, and she will be discharged on baby aspirin instead.   CODE STATUS: The patient is DNR.   TOTAL TIME SPENT: 35 minutes.   ____________________________ Krystal Eaton, MD sa:lb D: 03/10/2013 14:27:15 ET T: 03/10/2013 14:47:54 ET JOB#: [id number]  cc: Krystal Eaton, MD, <Dictator> Krystal Eaton MD ELECTRONICALLY SIGNED 03/31/2013 2:59

## 2014-11-28 IMAGING — CR DG CHEST 1V PORT
1 series · 1 of 1 positions shown · non-contrast
Comparison: none

REASON FOR EXAM: Shortness of Breath
COMMENTS:

PROCEDURE:     DXR - DXR PORTABLE CHEST SINGLE VIEW  - March 04, 2013  [DATE]
RESULT:     No prior studies. Cardiomegaly and pulmonary vascular prominence
with interstitial prominence and small possible right pleural effusion. Mild
CHF should be considered.

[ap]
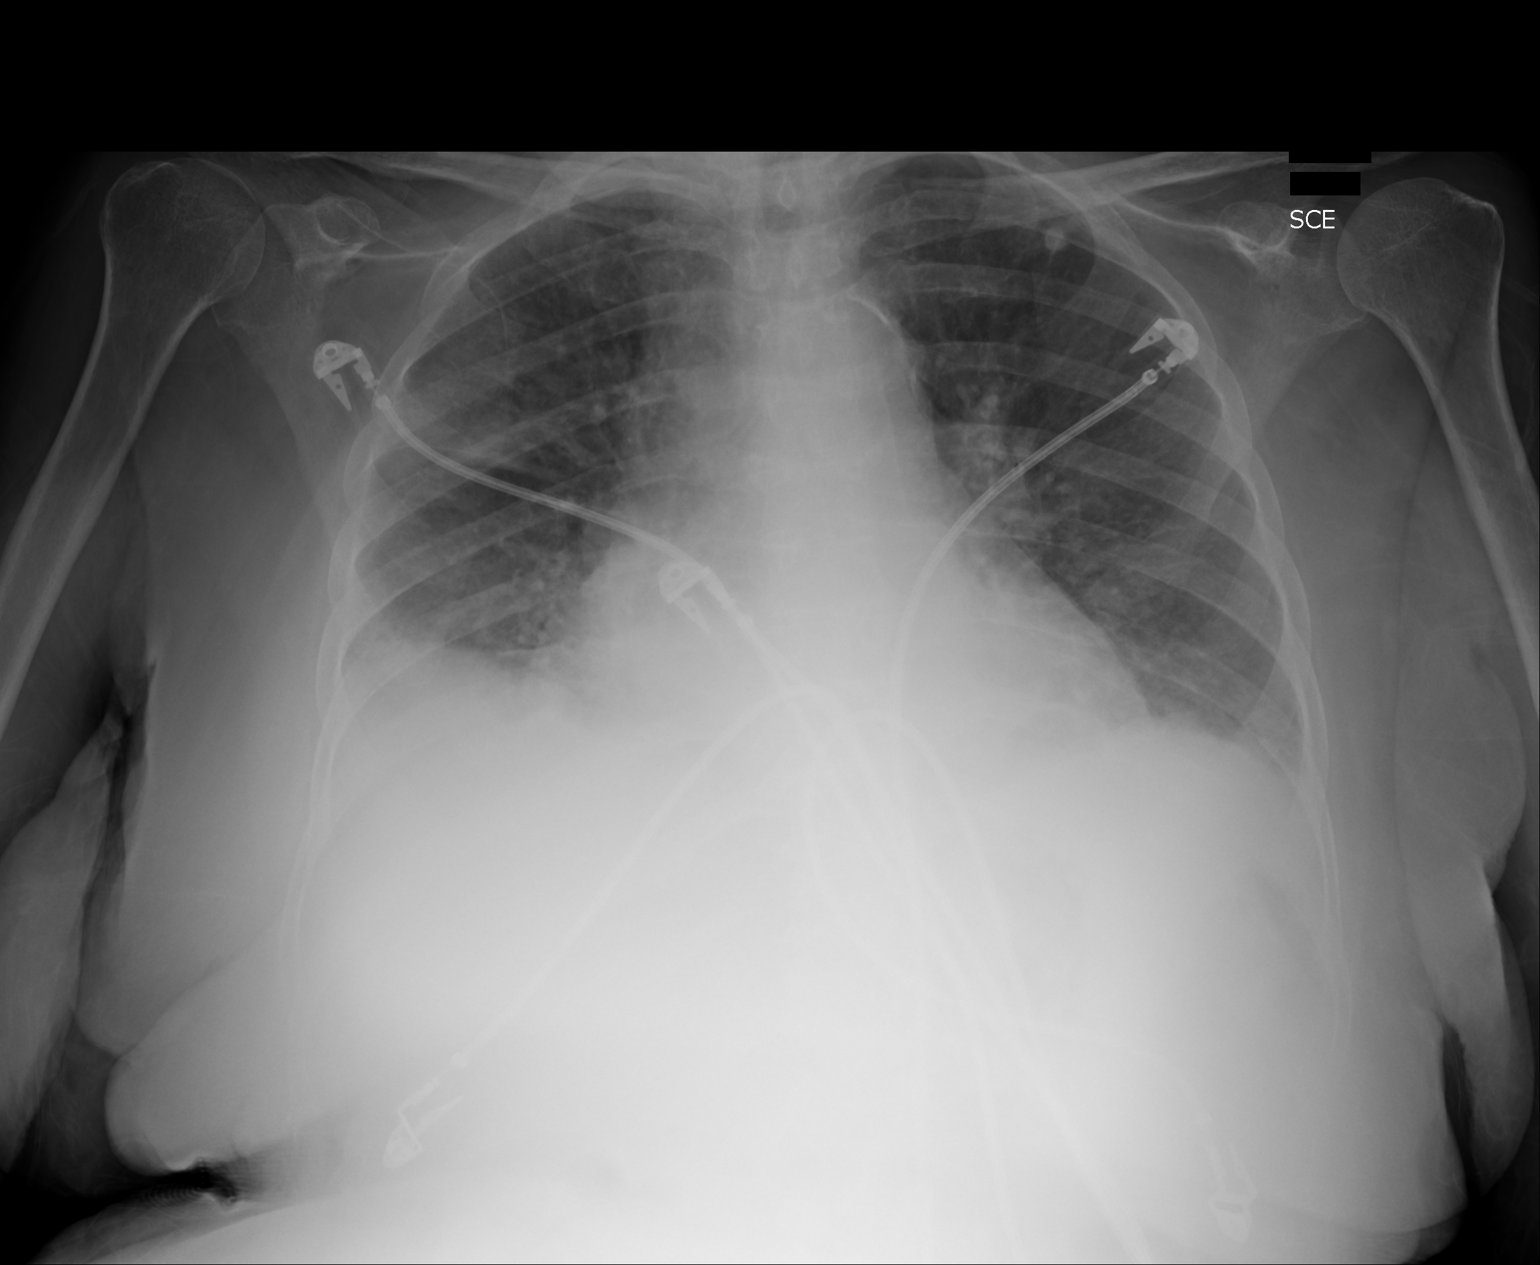

[1 of 1 positions shown; findings below may reference images not displayed]

IMPRESSION: Findings suggesting mild CHF.

## 2014-11-28 IMAGING — CT CT HEAD WITHOUT CONTRAST
2 series · 15 of 30 positions shown, 19 images · non-contrast
Comparison: none

REASON FOR EXAM: ams
COMMENTS:

[Series 3: bone · axial · 0.43mm/px · z∈[-96,-76]mm · 2 of 29 slices shown]
[im 3/29  bone]
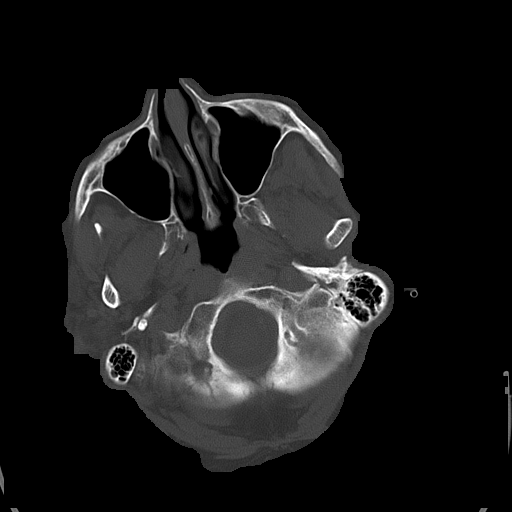
[im 7/29  bone]
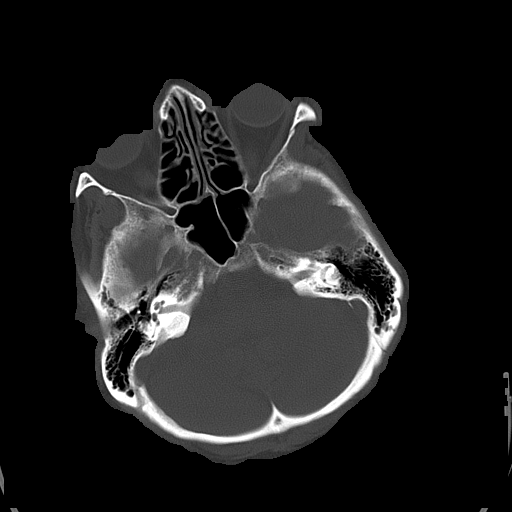

[Series 4: soft (id) · axial · 0.43mm/px · z∈[-94,+26]mm · 13 of 30 slices shown, 17 images]
[im 3/30  brain]
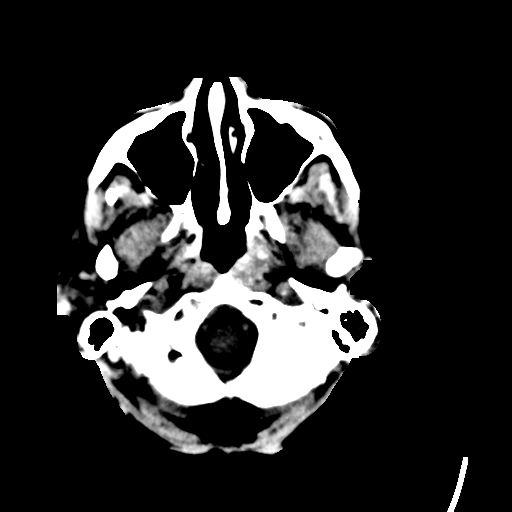
[im 3/30  bone]
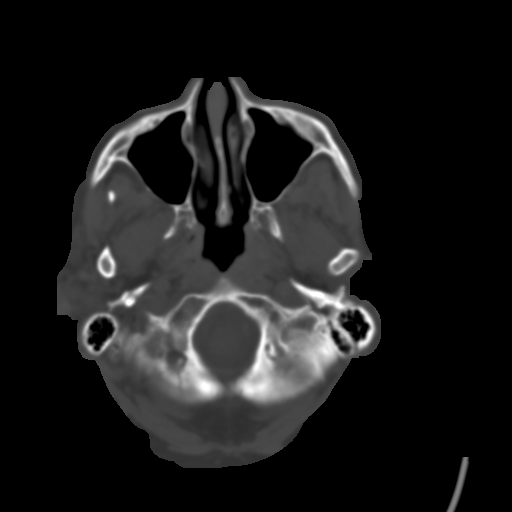
[im 5/30  brain]
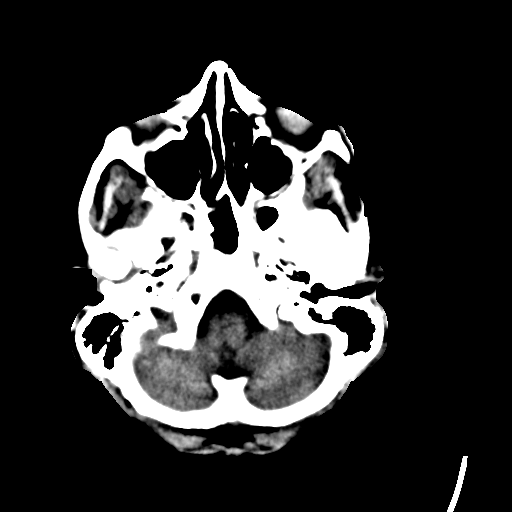
[im 7/30  brain]
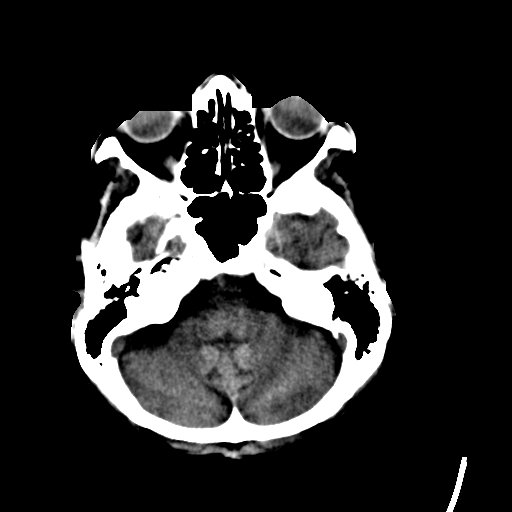
[im 9/30  brain]
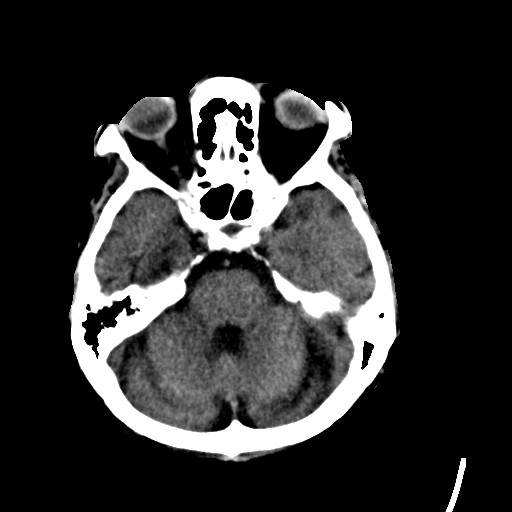
[im 11/30  brain]
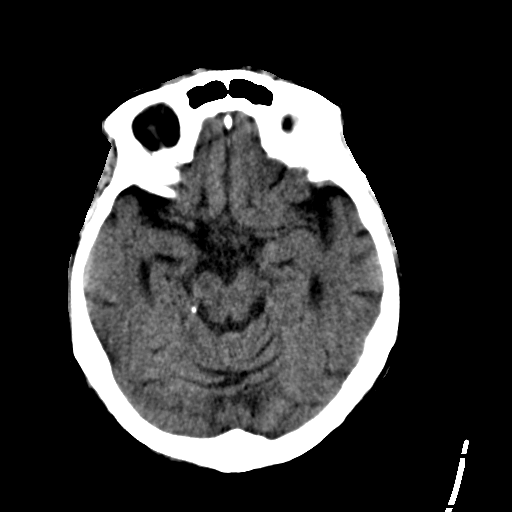
[im 11/30  bone]
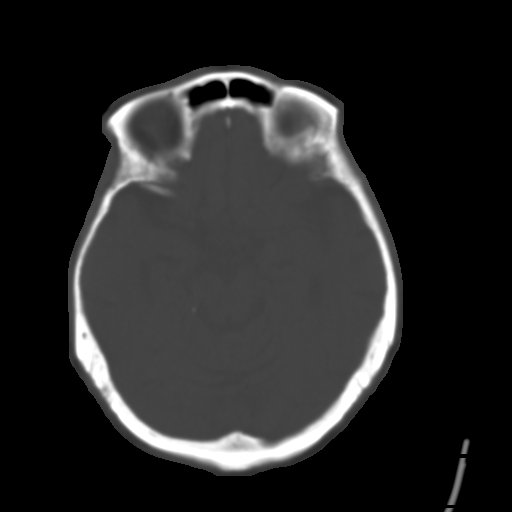
[im 13/30  brain]
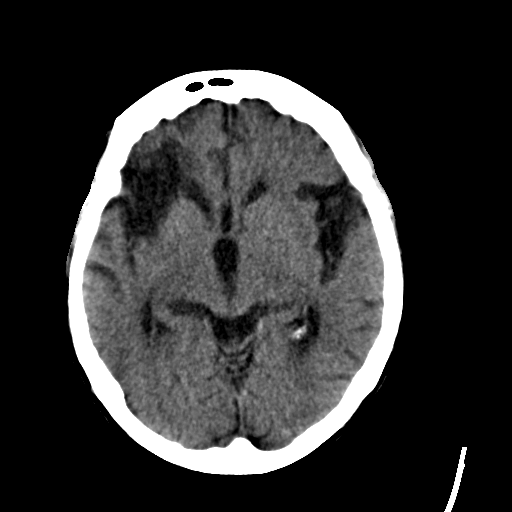
[im 15/30  brain]
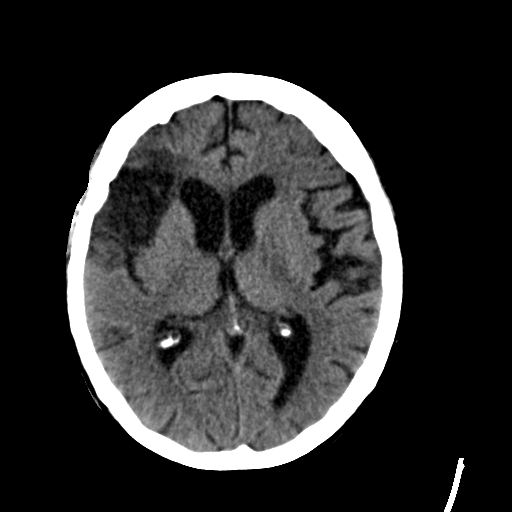
[im 17/30  brain]
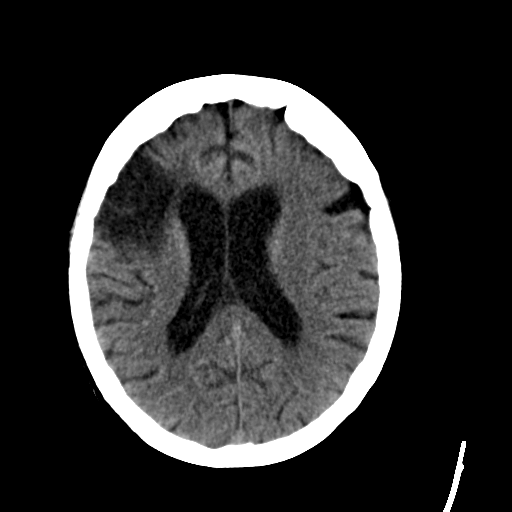
[im 19/30  brain]
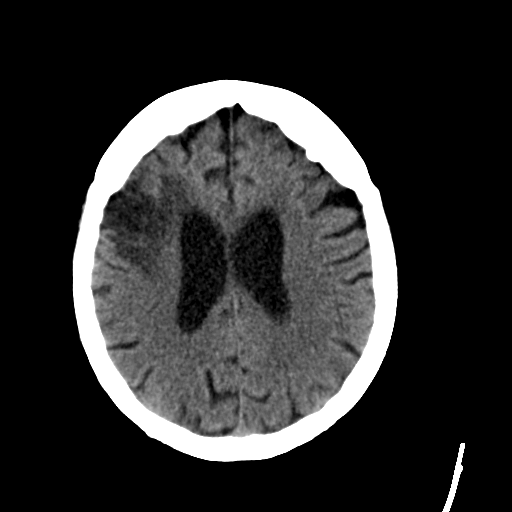
[im 19/30  bone]
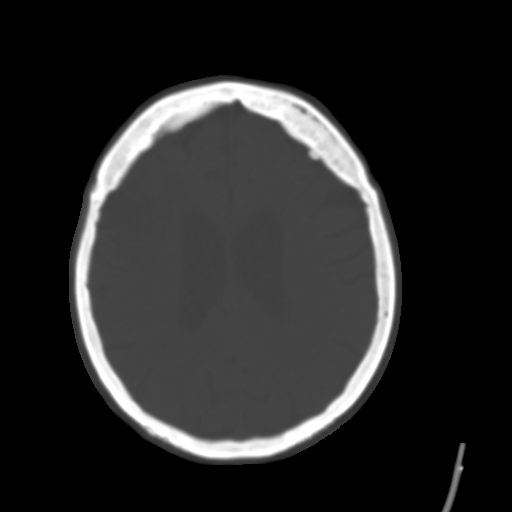
[im 21/30  brain]
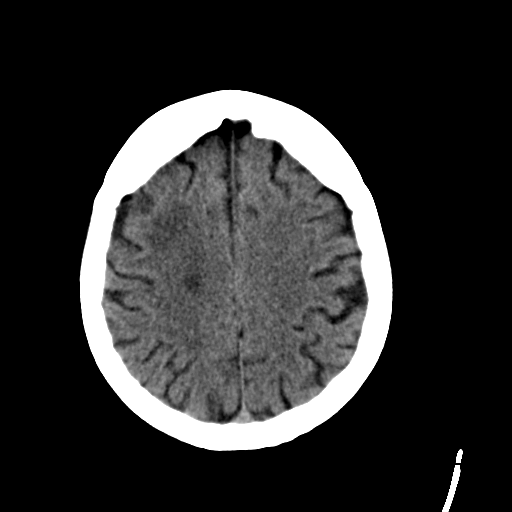
[im 23/30  brain]
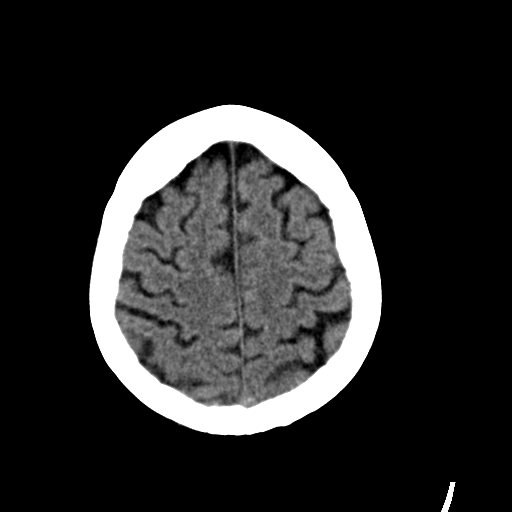
[im 25/30  brain]
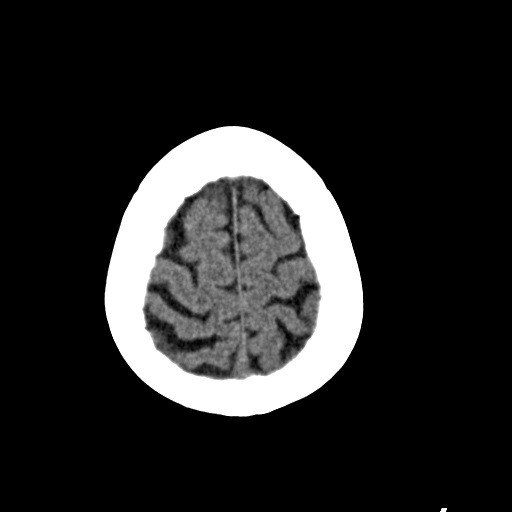
[im 27/30  brain]
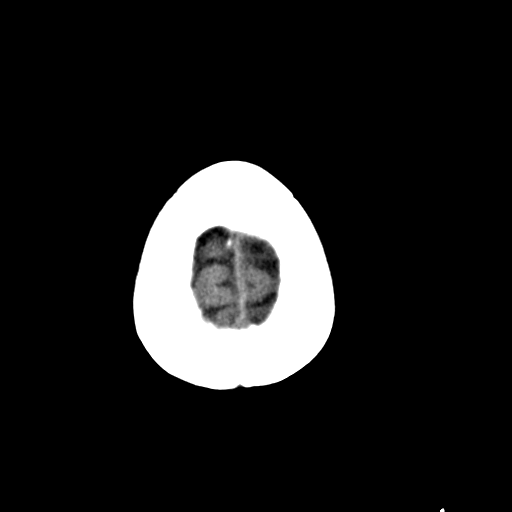
[im 27/30  bone]
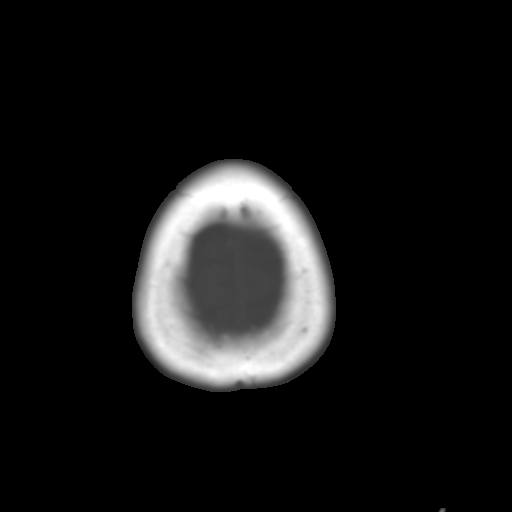

[15 of 30 positions shown; findings below may reference images not displayed]

PROCEDURE:     CT  - CT HEAD WITHOUT CONTRAST  - March 04, 2013  [DATE]

RESULT:     Axial noncontrast CT scanning was performed through the brain
with reconstructions at 5 mm intervals and slice thicknesses. Comparison is
made to the study 27 October, 2012.

There is a large area of stable encephalomalacia in the right frontal lobe.
There is mild diffuse cerebral and cerebellar atrophy with compensatory
ventriculomegaly which is also stable. There is no evidence of an acute
intracranial hemorrhage nor of an evolving ischemic infarction. The
cerebellum and brainstem are normal in density.

At bone window settings there is no evidence of an acute skull fracture. The
observed portions of the paranasal sinuses and mastoid air cells are clear.
IMPRESSION: 1. There is no evidence of an acute ischemic or hemorrhagic infarction.
2. There is no intracranial mass effect. There is encephalomalacia in the
right frontal lobe which is stable.
3. There is mild diffuse cerebral and cerebellar atrophy with compensatory
ventriculomegaly which is stable.

[REDACTED]

## 2014-11-30 IMAGING — CR DG CHEST 1V PORT
1 series · 1 of 1 positions shown · non-contrast
Comparison: none

REASON FOR EXAM: eval for infiltrate
COMMENTS:

PROCEDURE:     DXR - DXR PORTABLE CHEST SINGLE VIEW  - March 06, 2013 [DATE]
RESULT:     Frontal view the chest 03/06/2013 comparison 03/04/2013

[ap]
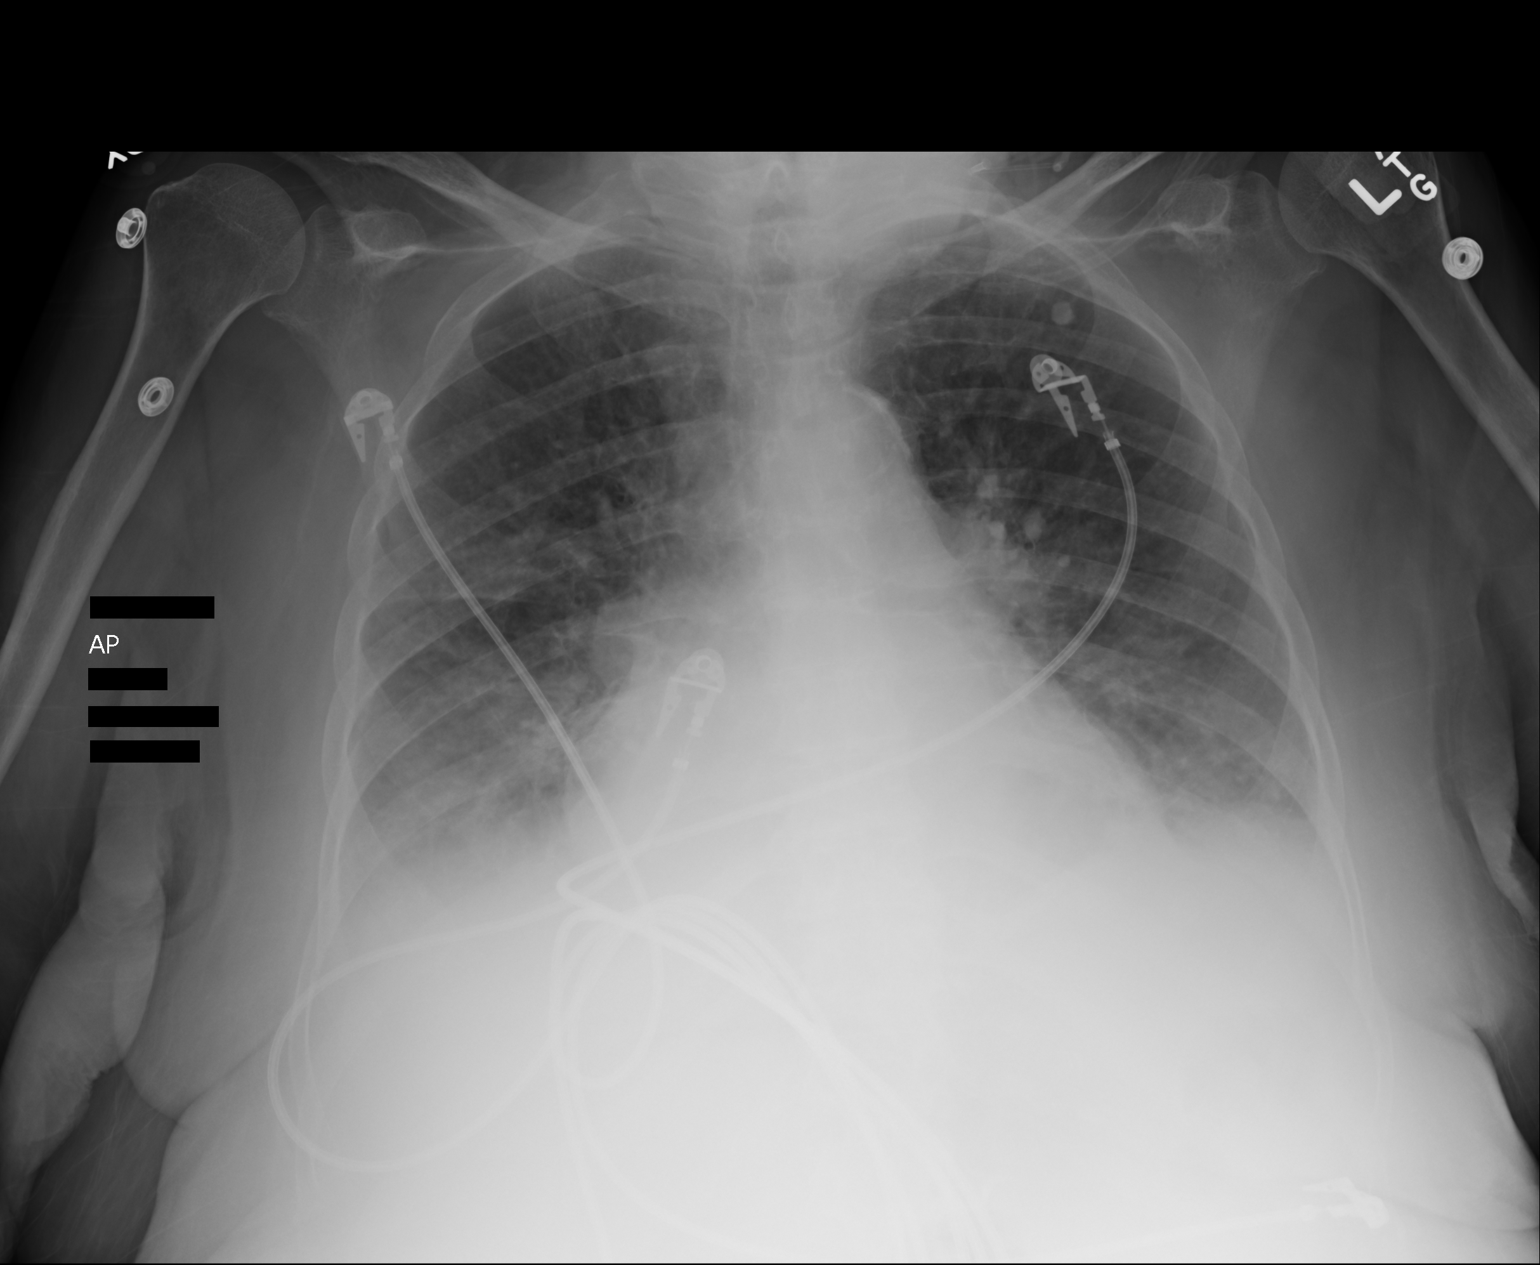

[1 of 1 positions shown; findings below may reference images not displayed]

FINDINGS: Increased bibasilar densities. Mild prominence of interstitial
markings. Cardiac silhouette moderately enlarged. Visualized bony skeleton
unremarkable.
IMPRESSION: Atelectasis versus infiltrate lung bases
2. Interstitial infiltrate differential considerations infectious versus
inflammatory versus pulmonary edema surveillance evaluation recommended.

## 2016-07-10 DEATH — deceased
